# Patient Record
Sex: Male | Born: 1995 | Race: Black or African American | Hispanic: No | Marital: Single | Smoking: Current every day smoker
Health system: Southern US, Community
[De-identification: ages and names within clinical notes are randomized; demographics above are authoritative.]

---

## 2016-12-26 ENCOUNTER — Encounter (HOSPITAL_COMMUNITY): Payer: Self-pay | Admitting: Emergency Medicine

## 2016-12-26 ENCOUNTER — Emergency Department (HOSPITAL_COMMUNITY)
Admission: EM | Admit: 2016-12-26 | Discharge: 2016-12-26 | Disposition: A | Discharge status: Home / Self Care | Payer: Self-pay | Attending: Emergency Medicine | Admitting: Emergency Medicine

## 2016-12-26 ENCOUNTER — Emergency Department (HOSPITAL_COMMUNITY): Payer: Self-pay

## 2016-12-26 DIAGNOSIS — Y929 Unspecified place or not applicable: Secondary | ICD-10-CM | POA: Insufficient documentation

## 2016-12-26 DIAGNOSIS — W3400XA Accidental discharge from unspecified firearms or gun, initial encounter: Secondary | ICD-10-CM

## 2016-12-26 DIAGNOSIS — Y9389 Activity, other specified: Secondary | ICD-10-CM | POA: Insufficient documentation

## 2016-12-26 DIAGNOSIS — S41001A Unspecified open wound of right shoulder, initial encounter: Secondary | ICD-10-CM | POA: Insufficient documentation

## 2016-12-26 DIAGNOSIS — Y249XXA Unspecified firearm discharge, undetermined intent, initial encounter: Secondary | ICD-10-CM | POA: Insufficient documentation

## 2016-12-26 DIAGNOSIS — F17210 Nicotine dependence, cigarettes, uncomplicated: Secondary | ICD-10-CM | POA: Insufficient documentation

## 2016-12-26 DIAGNOSIS — Y998 Other external cause status: Secondary | ICD-10-CM | POA: Insufficient documentation

## 2016-12-26 LAB — PREPARE FRESH FROZEN PLASMA
UNIT DIVISION: 0
Unit division: 0

## 2016-12-26 LAB — TYPE AND SCREEN
ABO/RH(D): A NEG
ANTIBODY SCREEN: NEGATIVE
UNIT DIVISION: 0
Unit division: 0

## 2016-12-26 LAB — BPAM RBC
BLOOD PRODUCT EXPIRATION DATE: 201807142359
BLOOD PRODUCT EXPIRATION DATE: 201807202359
ISSUE DATE / TIME: 201807071513
ISSUE DATE / TIME: 201807071513
UNIT TYPE AND RH: 9500
Unit Type and Rh: 9500

## 2016-12-26 LAB — BPAM FFP
Blood Product Expiration Date: 201807122359
Blood Product Expiration Date: 201807122359
ISSUE DATE / TIME: 201807071514
ISSUE DATE / TIME: 201807071514
UNIT TYPE AND RH: 600
Unit Type and Rh: 6200

## 2016-12-26 LAB — ABO/RH: ABO/RH(D): A NEG

## 2016-12-26 MED ORDER — FENTANYL CITRATE (PF) 100 MCG/2ML IJ SOLN
50.0000 ug | Freq: Once | INTRAMUSCULAR | Status: AC
  Administered 2016-12-26: 50 ug via INTRAVENOUS
  Filled 2016-12-26: qty 2

## 2016-12-26 MED ORDER — IBUPROFEN 600 MG PO TABS: 600.0000 mg | ORAL_TABLET | Freq: Four times a day (QID) | ORAL | 0 refills | Status: AC | PRN

## 2016-12-26 MED ORDER — HYDROCODONE-ACETAMINOPHEN 5-325 MG PO TABS: 1.0000 | ORAL_TABLET | ORAL | 0 refills | Status: AC | PRN

## 2016-12-26 MED ORDER — TETANUS-DIPHTH-ACELL PERTUSSIS 5-2.5-18.5 LF-MCG/0.5 IM SUSP
0.5000 mL | Freq: Once | INTRAMUSCULAR | Status: AC
  Administered 2016-12-26: 0.5 mL via INTRAMUSCULAR
  Filled 2016-12-26: qty 0.5
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
## 2016-12-26 NOTE — Progress Notes (Signed)
Orthopedic Tech Progress Note Patient Details:  Lance Harper Apr 07, 1996 [id number]  Patient ID: Lance Barryormeiga Harper, male   DOB: Apr 07, 1996, 20 y.o.   MRN: [id number]   Lance Harper, Lance Harper 12/26/2016, 3:33 PM Made level 1 trauma visit
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
